# Patient Record
Sex: Female | Born: 1995 | Race: White | Hispanic: No | Marital: Single | Smoking: Former smoker
Health system: Southern US, Community
[De-identification: ages and names within clinical notes are randomized; demographics above are authoritative.]

---
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
## 2014-10-22 ENCOUNTER — Ambulatory Visit (INDEPENDENT_AMBULATORY_CARE_PROVIDER_SITE_OTHER): Payer: 59 | Admitting: Women's Health

## 2014-10-22 ENCOUNTER — Encounter: Payer: Self-pay | Admitting: Women's Health

## 2014-10-22 VITALS — BP 118/80 | Ht 66.0 in | Wt 169.0 lb

## 2014-10-22 DIAGNOSIS — Z01419 Encounter for gynecological examination (general) (routine) without abnormal findings: Secondary | ICD-10-CM

## 2014-10-22 DIAGNOSIS — N898 Other specified noninflammatory disorders of vagina: Secondary | ICD-10-CM | POA: Diagnosis not present

## 2014-10-22 DIAGNOSIS — Z3041 Encounter for surveillance of contraceptive pills: Secondary | ICD-10-CM

## 2014-10-22 DIAGNOSIS — Z113 Encounter for screening for infections with a predominantly sexual mode of transmission: Secondary | ICD-10-CM | POA: Diagnosis not present

## 2014-10-22 LAB — WET PREP FOR TRICH, YEAST, CLUE
CLUE CELLS WET PREP: NONE SEEN
TRICH WET PREP: NONE SEEN
WBC, Wet Prep HPF POC: NONE SEEN
YEAST WET PREP: NONE SEEN

## 2014-10-22 MED ORDER — DROSPIRENONE-ETHINYL ESTRADIOL 3-0.02 MG PO TABS: 1.0000 | ORAL_TABLET | Freq: Every day | ORAL | Status: DC

## 2014-10-22 NOTE — Patient Instructions (Signed)
Dyspareunia Dyspareunia is pain during sexual intercourse. It is most common in women, but it also happens in men.  CAUSES  Female The pain from this condition is usually felt when anything is put into the vagina, but any part of the genitals may cause pain during sex. Even sitting or wearing pants can cause pain. Sometimes, a cause cannot be found. Some causes of pain during intercourse are:  Infections of the skin around the vagina.  Vaginal infections, such as a yeast, bacterial, or viral infection.  Vaginismus. This is the inability to have anything put in the vagina even when the woman wants it to happen. There is an automatic muscle contraction and pain. The pain of the muscle contraction can be so severe that intercourse is impossible.  Allergic reaction from spermicides, semen, condoms, scented tampons, soaps, douches, and vaginal sprays.  A fluid-filled sac (cyst) on the Bartholin or Skene glands, located at the opening of the vagina.  Scar tissue in the vagina from a surgically enlarged opening (episiotomy) or tearing after delivering a baby.  Vaginal dryness. This is more common in menopause. The normal secretions of the vagina are decreased. Changes in estrogen levels and increased difficulty becoming aroused can cause painful sex. Vaginal dryness can also happen when taking birth control pills.  Thinning of the tissue (atrophy) of the vulva and vagina. This makes the area thinner, smaller, unable to stretch to accommodate a penis, and prone to infection and tearing.  Vulvar vestibulitis or vestibulodynia.This is a condition that causes pain involving the area around the entrance to the vagina.The most common cause in young women is birth control pills.Women with low estrogen levels (postmenopausal women) may also experience this.Other causes include allergic reactions, too many nerve endings, skin conditions, and pelvic muscles that cannot relax.  Vulvar dermatoses. This  includes skin conditions such as lichen sclerosus and lichen planus.  Lack of foreplay to lubricate the vagina. This can cause vaginal dryness.  Noncancerous tumors (fibroids) in the uterus.  Uterus lining tissue growing outside the uterus (endometriosis).  Pregnancy that starts in the fallopian tube (tubal pregnancy).  Pregnancy or breastfeeding your baby. This can cause vaginal dryness.  A tilting or prolapse of the uterus. Prolapse is when weak and stretched muscles around the uterus allow it to fall into the vagina.  Problems with the ovaries, cysts, or scar tissue. This may be worse with certain sexual positions.  Previous surgeries causing adhesions or scar tissue in the vagina or pelvis.  Bladder and intestinal problems.  Psychological problems (such as depression or anxiety). This may make pain worse.  Negative attitudes about sex, experiencing rape, sexual assault, and misinformation about sex. These issues are often related to some types of pain.  Previous pelvic infection, causing scar tissue in the pelvis and on the female organs.  Cyst or tumor on the ovary.  Cancer of the female organs.  Certain medicines.  Medical problems such as diabetes, arthritis, or thyroid disease. Female In men, there are many physical causes of sexual discomfort. Some causes of pain during intercourse are:  Infections of the prostate, bladder, or seminal vesicles. This can cause pain after ejaculation.  An inflamed bladder (interstitial cystitis). This may cause pain from ejaculation.  Gonorrheal infections. This may cause pain during ejaculation.  An inflamed urethra (urethritis) or inflamed prostate (prostatitis). This can make genital stimulation painful or uncomfortable.  Deformities of the penis, such as Peyronie's disease.  A tight foreskin.  Cancer of the female organs.    Psychological problems. This may make pain worse. DIAGNOSIS   Your caregiver will take a history and  have you describe where the pain is located (outside the vagina, in the vagina, in the pelvis). You may be asked when you experience pain, such as with penetration or with thrusting.  Following this, your caregiver will do a physical exam. Let your caregiver know if the exam is too painful.  During the final part of the female exam, your caregiver will feel your uterus and ovaries with one hand on the abdomen and one finger in your vagina. This is a pelvic exam.  Blood tests, a Pap test, cultures for infection, an ultrasound test, and X-rays may be done. You may need to see a specialist for female problems (gynecologist).  Your caregiver may do a CT scan, MRI, or laparoscopy. Laparoscopy is a procedure to look into the pelvis with a lighted tube, through a cut (incision) in the abdomen. TREATMENT  Your caregiver can help you determine the best course of treatment. Sometimes, more testing is done. Continue with the suggested testing until your caregiver feels sure about your diagnosis and how to treat it. Sometimes, it is difficult to find the reason for the pain. The search for the cause and treatment can be frustrating. Treatment often takes several weeks to a few months before you notice any improvement. You may also need to avoid sexual activity until symptoms improve.Continuing to have sex when it hurts can delay healing and actually make the problem worse. The treatment depends on the cause of the pain. Treatment may include:  Medicines such as antibiotics, vaginal or skin creams, hormones, or antidepressants.  Minor or major surgery.  Psychological counseling or group therapy.  Kegel exercises and vaginal dilators to help certain cases of vaginismus (spasms). Do this only if recommended by your caregiver.Kegel exercises can make some problems worse.  Applying lubrication as recommended by your caregiver if you have dryness.  Sex therapy for you and your sex partner. It is common for  the pain to continue after the reason for the pain has been treated. Some reasons for this include a conditioned response. This means the person having the pain becomes so familiar with the pain that the pain continues as a response, even though the cause is removed. Sex therapy can help with this problem. HOME CARE INSTRUCTIONS   Follow your caregiver's instructions about taking medicines, tests, counseling, and follow-up treatment.  Do not use scented tampons, douches, vaginal sprays, or soaps.  Use water-based lubricants for dryness. Oil lubricants can cause irritation.  Do not use spermicides or condoms that irritate you.  Openly discuss with your partner your sexual experience, your desires, foreplay, and different sexual positions for a more comfortable and enjoyable sexual relationship.  Join group sessions for therapy, if needed.  Practice safe sex at all times.  Empty your bladder before having intercourse.  Try different positions during sexual intercourse.  Take over-the-counter pain medicine recommended by your caregiver before having sexual intercourse.  Do not wear pantyhose. Knee-high and thigh-high hose are okay.  Avoid scrubbing your vulva with a washcloth. Wash the area gently and pat dry with a towel. SEEK MEDICAL CARE IF:   You develop vaginal bleeding after sexual intercourse.  You develop a lump at the opening of your vagina, even if it is not painful.  You have abnormal vaginal discharge.  You have vaginal dryness.  You have itching or irritation of the vulva or vagina.  You   develop a rash or reaction to your medicine. SEEK IMMEDIATE MEDICAL CARE IF:   You develop severe abdominal pain during or shortly after sexual intercourse. You could have a ruptured ovarian cyst or ruptured tubal pregnancy.  You have a fever.  You have painful or bloody urination.  You have painful sexual intercourse, and you never had it before.  You pass out after having  sexual intercourse.   This information is not intended to replace advice given to you by your health care provider. Make sure you discuss any questions you have with your health care provider.   Document Released: 01/10/2007 Document Revised: 03/15/2011 Document Reviewed: 07/23/2014 Elsevier Interactive Patient Education 2016 [place] Maintenance, Female Adopting a healthy lifestyle and getting preventive care can go a long way to promote health and wellness. Talk with your health care provider about what schedule of regular examinations is right for you. This is a good chance for you to check in with your provider about disease prevention and staying healthy. In between checkups, there are plenty of things you can do on your own. Experts have done a lot of research about which lifestyle changes and preventive measures are most likely to keep you healthy. Ask your health care provider for more information. WEIGHT AND DIET  Eat a healthy diet  Be sure to include plenty of vegetables, fruits, low-fat dairy products, and lean protein.  Do not eat a lot of foods high in solid fats, added sugars, or salt.  Get regular exercise. This is one of the most important things you can do for your health.  Most adults should exercise for at least 150 minutes each week. The exercise should increase your heart rate and make you sweat (moderate-intensity exercise).  Most adults should also do strengthening exercises at least twice a week. This is in addition to the moderate-intensity exercise.  Maintain a healthy weight  Body mass index (BMI) is a measurement that can be used to identify possible weight problems. It estimates body fat based on height and weight. Your health care provider can help determine your BMI and help you achieve or maintain a healthy weight.  For females 4 years of age and older:   A BMI below 18.5 is considered underweight.  A BMI of 18.5 to 24.9 is normal.  A  BMI of 25 to 29.9 is considered overweight.  A BMI of 30 and above is considered obese.  Watch levels of cholesterol and blood lipids  You should start having your blood tested for lipids and cholesterol at 20 years of age, then have this test every 5 years.  You may need to have your cholesterol levels checked more often if:  Your lipid or cholesterol levels are high.  You are older than 20 years of age.  You are at high risk for heart disease.  CANCER SCREENING   Lung Cancer  Lung cancer screening is recommended for adults 29-59 years old who are at high risk for lung cancer because of a history of smoking.  A yearly low-dose CT scan of the lungs is recommended for people who:  Currently smoke.  Have quit within the past 15 years.  Have at least a 30-pack-year history of smoking. A pack year is smoking an average of one pack of cigarettes a day for 1 year.  Yearly screening should continue until it has been 15 years since you quit.  Yearly screening should stop if you develop a health problem that would prevent  you from having lung cancer treatment.  Breast Cancer  Practice breast self-awareness. This means understanding how your breasts normally appear and feel.  It also means doing regular breast self-exams. Let your health care provider know about any changes, no matter how small.  If you are in your 20s or 30s, you should have a clinical breast exam (CBE) by a health care provider every 1-3 years as part of a regular health exam.  If you are 46 or older, have a CBE every year. Also consider having a breast X-ray (mammogram) every year.  If you have a family history of breast cancer, talk to your health care provider about genetic screening.  If you are at high risk for breast cancer, talk to your health care provider about having an MRI and a mammogram every year.  Breast cancer gene (BRCA) assessment is recommended for women who have family members with  BRCA-related cancers. BRCA-related cancers include:  Breast.  Ovarian.  Tubal.  Peritoneal cancers.  Results of the assessment will determine the need for genetic counseling and BRCA1 and BRCA2 testing. Cervical Cancer Your health care provider may recommend that you be screened regularly for cancer of the pelvic organs (ovaries, uterus, and vagina). This screening involves a pelvic examination, including checking for microscopic changes to the surface of your cervix (Pap test). You may be encouraged to have this screening done every 3 years, beginning at age 43.  For women ages 29-65, health care providers may recommend pelvic exams and Pap testing every 3 years, or they may recommend the Pap and pelvic exam, combined with testing for human papilloma virus (HPV), every 5 years. Some types of HPV increase your risk of cervical cancer. Testing for HPV may also be done on women of any age with unclear Pap test results.  Other health care providers may not recommend any screening for nonpregnant women who are considered low risk for pelvic cancer and who do not have symptoms. Ask your health care provider if a screening pelvic exam is right for you.  If you have had past treatment for cervical cancer or a condition that could lead to cancer, you need Pap tests and screening for cancer for at least 20 years after your treatment. If Pap tests have been discontinued, your risk factors (such as having a new sexual partner) need to be reassessed to determine if screening should resume. Some women have medical problems that increase the chance of getting cervical cancer. In these cases, your health care provider may recommend more frequent screening and Pap tests. Colorectal Cancer  This type of cancer can be detected and often prevented.  Routine colorectal cancer screening usually begins at 20 years of age and continues through 20 years of age.  Your health care provider may recommend screening at  an earlier age if you have risk factors for colon cancer.  Your health care provider may also recommend using home test kits to check for hidden blood in the stool.  A small camera at the end of a tube can be used to examine your colon directly (sigmoidoscopy or colonoscopy). This is done to check for the earliest forms of colorectal cancer.  Routine screening usually begins at age 63.  Direct examination of the colon should be repeated every 5-10 years through 20 years of age. However, you may need to be screened more often if early forms of precancerous polyps or small growths are found. Skin Cancer  Check your skin from head to  toe regularly.  Tell your health care provider about any new moles or changes in moles, especially if there is a change in a mole's shape or color.  Also tell your health care provider if you have a mole that is larger than the size of a pencil eraser.  Always use sunscreen. Apply sunscreen liberally and repeatedly throughout the day.  Protect yourself by wearing long sleeves, pants, a wide-brimmed hat, and sunglasses whenever you are outside. HEART DISEASE, DIABETES, AND HIGH BLOOD PRESSURE   High blood pressure causes heart disease and increases the risk of stroke. High blood pressure is more likely to develop in:  People who have blood pressure in the high end of the normal range (130-139/85-89 mm Hg).  People who are overweight or obese.  People who are African American.  If you are 57-35 years of age, have your blood pressure checked every 3-5 years. If you are 63 years of age or older, have your blood pressure checked every year. You should have your blood pressure measured twice--once when you are at a hospital or clinic, and once when you are not at a hospital or clinic. Record the average of the two measurements. To check your blood pressure when you are not at a hospital or clinic, you can use:  An automated blood pressure machine at a  pharmacy.  A home blood pressure monitor.  If you are between 40 years and 41 years old, ask your health care provider if you should take aspirin to prevent strokes.  Have regular diabetes screenings. This involves taking a blood sample to check your fasting blood sugar level.  If you are at a normal weight and have a low risk for diabetes, have this test once every three years after 20 years of age.  If you are overweight and have a high risk for diabetes, consider being tested at a younger age or more often. PREVENTING INFECTION  Hepatitis B  If you have a higher risk for hepatitis B, you should be screened for this virus. You are considered at high risk for hepatitis B if:  You were born in a country where hepatitis B is common. Ask your health care provider which countries are considered high risk.  Your parents were born in a high-risk country, and you have not been immunized against hepatitis B (hepatitis B vaccine).  You have HIV or AIDS.  You use needles to inject street drugs.  You live with someone who has hepatitis B.  You have had sex with someone who has hepatitis B.  You get hemodialysis treatment.  You take certain medicines for conditions, including cancer, organ transplantation, and autoimmune conditions. Hepatitis C  Blood testing is recommended for:  Everyone born from [birth year] through 1966.  Anyone with known risk factors for hepatitis C. Sexually transmitted infections (STIs)  You should be screened for sexually transmitted infections (STIs) including gonorrhea and chlamydia if:  You are sexually active and are younger than 20 years of age.  You are older than 20 years of age and your health care provider tells you that you are at risk for this type of infection.  Your sexual activity has changed since you were last screened and you are at an increased risk for chlamydia or gonorrhea. Ask your health care provider if you are at risk.  If you do not  have HIV, but are at risk, it may be recommended that you take a prescription medicine daily to prevent HIV infection. This  is called pre-exposure prophylaxis (PrEP). You are considered at risk if:  You are sexually active and do not regularly use condoms or know the HIV status of your partner(s).  You take drugs by injection.  You are sexually active with a partner who has HIV. Talk with your health care provider about whether you are at high risk of being infected with HIV. If you choose to begin PrEP, you should first be tested for HIV. You should then be tested every 3 months for as long as you are taking PrEP.  PREGNANCY   If you are premenopausal and you may become pregnant, ask your health care provider about preconception counseling.  If you may become pregnant, take 400 to 800 micrograms (mcg) of folic acid every day.  If you want to prevent pregnancy, talk to your health care provider about birth control (contraception). OSTEOPOROSIS AND MENOPAUSE   Osteoporosis is a disease in which the bones lose minerals and strength with aging. This can result in serious bone fractures. Your risk for osteoporosis can be identified using a bone density scan.  If you are 24 years of age or older, or if you are at risk for osteoporosis and fractures, ask your health care provider if you should be screened.  Ask your health care provider whether you should take a calcium or vitamin D supplement to lower your risk for osteoporosis.  Menopause may have certain physical symptoms and risks.  Hormone replacement therapy may reduce some of these symptoms and risks. Talk to your health care provider about whether hormone replacement therapy is right for you.  HOME CARE INSTRUCTIONS   Schedule regular health, dental, and eye exams.  Stay current with your immunizations.   Do not use any tobacco products including cigarettes, chewing tobacco, or electronic cigarettes.  If you are pregnant, do not  drink alcohol.  If you are breastfeeding, limit how much and how often you drink alcohol.  Limit alcohol intake to no more than 1 drink per day for nonpregnant women. One drink equals 12 ounces of beer, 5 ounces of wine, or 1 ounces of hard liquor.  Do not use street drugs.  Do not share needles.  Ask your health care provider for help if you need support or information about quitting drugs.  Tell your health care provider if you often feel depressed.  Tell your health care provider if you have ever been abused or do not feel safe at home.   This information is not intended to replace advice given to you by your health care provider. Make sure you discuss any questions you have with your health care provider.   Document Released: 07/06/2010 Document Revised: 01/11/2014 Document Reviewed: 11/22/2012 Elsevier Interactive Patient Education [employer].

## 2014-10-22 NOTE — Progress Notes (Signed)
Traci Jones [birth year] [id number]    History:    Presents for annual exam.  Complains of pain during and after sexual intercourse. Has one partner who is not monogamous, does not use condoms. Denies abdominal pain, nausea, vaginal discharge or odor, urinary frequency, or dysuria. On OCP with no complaints. Menstrual cycles are regular and controlled. Began menstrual cycle today. Has no other issues or complaints.  Thinks that she received gardasil is not sure  Past medical history, past surgical history, family history and social history Part time student at [employer]. Major undecided. Works two jobs . Lives at home with parents.   ROS:  A ROS was performed and pertinent positives and negatives are included.  Exam:  Filed Vitals:   10/22/14 1454  BP: 118/80    General appearance:  Normal Thyroid:  Symmetrical, normal in size, without palpable masses or nodularity. Respiratory  Auscultation:  Clear without wheezing or rhonchi Cardiovascular  Auscultation:  Regular rate, without rubs, murmurs or gallops  Edema/varicosities:  Not grossly evident Abdominal  Soft,nontender, without masses, guarding or rebound.  Liver/spleen:  No organomegaly noted  Hernia:  None appreciated  Skin  Inspection:  Grossly normal   Breasts: Examined lying and sitting.     Right: Without masses, retractions, discharge or axillary adenopathy.     Left: Without masses, retractions, discharge or axillary adenopathy. Gentitourinary   Inguinal/mons:  Normal without inguinal adenopathy  External genitalia:  Normal  BUS/Urethra/Skene's glands:  Normal  Vagina:  Normal, moderate amount of menstrual blood noted in canal.   Cervix:  Normal  Uterus:   normal in size, shape and contour.  Midline and mobile  Adnexa/parametria:     Rt: Without masses or tenderness.   Lt: Without masses or tenderness.  Anus and perineum: Normal    Wet prep negative.   Assessment/Plan:  19 y.o. S WF G0 for annual exam.    Regular cycles on OCs STD screen Dyspareunia  Plan:  CBC, UA with culture pending, GC/Chlamydia, HIV, hep B, C, RPR. OCs reviewed was on Yasmin 3-0.03 Mg changed to Yaz 3-0.02 Mg tablet. Condoms encouraged until permanent partner. SBE's, campus safety, decrease calories, exercise at least a few times a week, wear seatbelt encouraged. Follow up annually, or sooner for questions/concerns. Instructed to find out if received gardasil if not return to office for series.    Huel Cote [facility], 3:47 PM 10/22/2014

## 2014-10-22 NOTE — Progress Notes (Deleted)
Traci Jones 05/02/1995 [id number]    History:    Presents for annual exam.  ***  Past medical history, past surgical history, family history and social history were all reviewed and documented in the EPIC chart.  ROS:  A ROS was performed and pertinent positives and negatives are included.  Exam:  Filed Vitals:   10/22/14 1454  BP: 118/80    General appearance:  Normal Thyroid:  Symmetrical, normal in size, without palpable masses or nodularity. Respiratory  Auscultation:  Clear without wheezing or rhonchi Cardiovascular  Auscultation:  Regular rate, without rubs, murmurs or gallops  Edema/varicosities:  Not grossly evident Abdominal  Soft,nontender, without masses, guarding or rebound.  Liver/spleen:  No organomegaly noted  Hernia:  None appreciated  Skin  Inspection:  Grossly normal   Breasts: Examined lying and sitting.     Right: Without masses, retractions, discharge or axillary adenopathy.     Left: Without masses, retractions, discharge or axillary adenopathy. Gentitourinary   Inguinal/mons:  Normal without inguinal adenopathy  External genitalia:  Normal  BUS/Urethra/Skene's glands:  Normal  Vagina:  Normal  Cervix:  Normal  Uterus:  ***verted, normal in size, shape and contour.  Midline and mobile  Adnexa/parametria:     Rt: Without masses or tenderness.   Lt: Without masses or tenderness.  Anus and perineum: Normal  Digital rectal exam: Normal sphincter tone without palpated masses or tenderness  Assessment/Plan:  19 y.o.  for annual exam.   ***.    Huel Cote [facility], 3:43 PM 10/22/2014

## 2014-10-23 LAB — URINALYSIS W MICROSCOPIC + REFLEX CULTURE
BACTERIA UA: NONE SEEN [HPF]
BILIRUBIN URINE: NEGATIVE
Casts: NONE SEEN [LPF]
Crystals: NONE SEEN [HPF]
GLUCOSE, UA: NEGATIVE
LEUKOCYTES UA: NEGATIVE
NITRITE: NEGATIVE
PH: 5.5 (ref 5.0–8.0)
PROTEIN: NEGATIVE
RBC / HPF: >60 RBC/HPF — AB (ref <=2)
SPECIFIC GRAVITY, URINE: 1.032 (ref 1.001–1.035)
WBC UA: NONE SEEN WBC/HPF (ref <=5)
YEAST: NONE SEEN [HPF]

## 2014-10-23 LAB — GC/CHLAMYDIA PROBE AMP
CT Probe RNA: NEGATIVE
GC Probe RNA: NEGATIVE

## 2014-10-24 LAB — URINE CULTURE
Colony Count: NO GROWTH
ORGANISM ID, BACTERIA: NO GROWTH

## 2015-03-06 ENCOUNTER — Ambulatory Visit (INDEPENDENT_AMBULATORY_CARE_PROVIDER_SITE_OTHER): Payer: 59 | Admitting: Obstetrics and Gynecology

## 2015-03-06 ENCOUNTER — Encounter: Payer: Self-pay | Admitting: Obstetrics and Gynecology

## 2015-03-06 VITALS — BP 100/62 | HR 76 | Resp 18 | Ht 67.0 in | Wt 163.0 lb

## 2015-03-06 DIAGNOSIS — L739 Follicular disorder, unspecified: Secondary | ICD-10-CM

## 2015-03-06 MED ORDER — MUPIROCIN CALCIUM 2 % EX CREA: 1.0000 "application " | TOPICAL_CREAM | Freq: Two times a day (BID) | CUTANEOUS | Status: AC

## 2015-03-06 NOTE — Progress Notes (Signed)
Patient ID: Traci Jones, female   DOB: 1995/02/11, 20 y.o.   MRN: [id number] GYNECOLOGY  VISIT   HPI: 20 y.o.   Single  Caucasian  female   [id number] with Patient's last menstrual period was 02/06/2015 (approximate).   here for tender "bump"in perineal area for 2 weeks.  She also complains of ingrown hairs in pubic area.    Told to stop shaving by prior provider.  Also reports a lump in her labia.  Not painful.  Tried to drain it and she got blood out of it.   Not using OTC products.  Bought something called tinned skin but has not used in a long time.  Had scabies. Treated through dermatology 6 months ago.   Had shingles last year.  Had outbreak on her chest.   New new exposures.   New partner - female.   GYNECOLOGIC HISTORY: Patient's last menstrual period was 02/06/2015 (approximate). Contraception:OCPs--Yaz Menopausal hormone therapy: n/a Last mammogram: n/a Last pap smear: n/a        OB History    Gravida Para Term Preterm AB TAB SAB Ectopic Multiple Living   0 0 0 0 0 0 0 0 0 0          There are no active problems to display for this patient.   History reviewed. No pertinent past medical history.  History reviewed. No pertinent past surgical history.  Current Outpatient Prescriptions  Medication Sig Dispense Refill  . drospirenone-ethinyl estradiol (YASMIN 28) 3-0.03 MG tablet Take 1 tablet by mouth daily.    . mupirocin cream (BACTROBAN) 2 % Apply 1 application topically 2 (two) times daily. 15 g 0   No current facility-administered medications for this visit.     ALLERGIES: Review of patient's allergies indicates no known allergies.  Family History  Problem Relation Age of Onset  . Prostate cancer Father   . Emphysema Maternal Grandmother   . Lung cancer Maternal Grandmother   . Lung cancer Maternal Grandfather     Social History   Social History  . Marital Status: Single    Spouse Name: N/A  . Number of Children: N/A  . Years of  Education: N/A   Occupational History  . Not on file.   Social History Main Topics  . Smoking status: Former Research scientist (life sciences)  . Smokeless tobacco: Not on file  . Alcohol Use: 0.0 oz/week    0 Standard drinks or equivalent per week     Comment: 1-2 drinks per month  . Drug Use: Yes    Special: Marijuana  . Sexual Activity:    Partners: Female, Female    Birth Control/ Protection: Pill     Comment: INTERCOURSE AGE 81, SEXUAL PARTNERS LESS THAN 5   Other Topics Concern  . Not on file   Social History Narrative    ROS:  Pertinent items are noted in HPI.  PHYSICAL EXAMINATION:    BP 100/62 mmHg  Pulse 76  Resp 18  Ht 5\' 7"  (1.702 m)  Wt 163 lb (73.936 kg)  BMI 25.52 kg/m2  LMP 02/06/2015 (Approximate)    General appearance: alert, cooperative and appears stated age Head: Normocephalic, without obvious abnormality, atraumatic Lungs: clear to auscultation bilaterally Heart: regular rate and rhythm Abdomen: soft, non-tender; bowel sounds normal; no masses,  no organomegaly No abnormal inguinal nodes palpated   Pelvic: External genitalia:   Small 5 mm abscess of the right labia minora.  Mildly tender.  Some inflammation of hair follicles.  Urethra:  normal appearing urethra with no masses, tenderness or lesions              Bartholins and Skenes: normal                 Vagina: normal appearing vagina with normal color and discharge, no lesions              Cervix: no lesions             Bimanual Exam:  Uterus:  normal size, contour, position, consistency, mobility, non-tender              Adnexa: normal adnexa and no mass, fullness, tenderness           Chaperone was present for exam.  ASSESSMENT  Folliculitis.  Very small vulvar abscess from folliculitis.   PLAN  Counseled regarding folliculitis.  Bactroban bid for one week.  I do not think the patient needs an oral abx.  Warm compresses.  Declines STD testing.  Discussed female condom use.  Follow up for  annual exam at end of October or beginning of November.  Follow up for yearly exams and prn.    An After Visit Summary was printed and given to the patient.  _20_____ minutes face to face time of which over 50% was spent in counseling.

## 2015-03-06 NOTE — Patient Instructions (Addendum)
Mupirocin skin cream or ointment What is this medicine? MUPIROCIN (myoo PEER oh sin) is an antibiotic. It is used on the skin to treat skin infections. This medicine may be used for other purposes; ask your health care provider or pharmacist if you have questions. What should I tell my health care provider before I take this medicine? They need to know if you have any of these conditions: -an unusual or allergic reaction to mupirocin, polyethylene glycol (PEG), or other topical antibiotic medicine -pregnant or trying to get pregnant -breast-feeding How should I use this medicine? This medicine is for external use only. Follow the directions on the prescription label. Wash your hands before and after use. Before applying, wash the affected area with mild soap and water and pat dry. Apply a small amount to the affected area and rub gently. You can cover the area with a gauze dressing. Do not get this medicine in your eyes. If you do, rinse out with plenty of cool tap water. Do not use your medicine more often than directed. Finish the full course of medicine prescribed by your doctor or health care professional even if you think your condition is better. Do not use over large areas of burnt skin. Talk to your pediatrician regarding the use of this medicine in children. Special care may be needed. Overdosage: If you think you have taken too much of this medicine contact a poison control center or emergency room at once. NOTE: This medicine is only for you. Do not share this medicine with others. What if I miss a dose? If you miss a dose, take it as soon as you can. If it is almost time for your next dose, take only that dose. Do not take double or extra doses. What may interact with this medicine? Interactions are not expected. Do not use any other skin products on the affected area without telling your doctor or health care professional. This list may not describe all possible interactions. Give your  health care provider a list of all the medicines, herbs, non-prescription drugs, or dietary supplements you use. Also tell them if you smoke, drink alcohol, or use illegal drugs. Some items may interact with your medicine. What should I watch for while using this medicine? Tell your doctor or health care professional if your skin condition does not begin to improve within 3 to 5 days. What side effects may I notice from receiving this medicine? Side effects that you should report to your doctor or health care professional as soon as possible: -skin rash, redness, continued swelling, burning, itching, stinging, or pain Side effects that usually do not require medical attention (report to your doctor or health care professional if they continue or are bothersome): -dry skin, itching This list may not describe all possible side effects. Call your doctor for medical advice about side effects. You may report side effects to FDA at 1-800-FDA-1088. Where should I keep my medicine? Keep out of the reach of children. Store at room temperature between 20 and 25 degrees C (68 and 77 degrees F). Throw away any unused medicine after the expiration date. NOTE: This sheet is a summary. It may not cover all possible information. If you have questions about this medicine, talk to your doctor, pharmacist, or health care provider.    2016, Elsevier/Gold Standard. (A999333 A999333)  Folliculitis Folliculitis is redness, soreness, and swelling (inflammation) of the hair follicles. This condition can occur anywhere on the body. People with weakened immune systems, diabetes,  or obesity have a greater risk of getting folliculitis. CAUSES  Bacterial infection. This is the most common cause.  Fungal infection.  Viral infection.  Contact with certain chemicals, especially oils and tars. Long-term folliculitis can result from bacteria that live in the nostrils. The bacteria may trigger multiple outbreaks of  folliculitis over time. SYMPTOMS Folliculitis most commonly occurs on the scalp, thighs, legs, back, buttocks, and areas where hair is shaved frequently. An early sign of folliculitis is a small, white or yellow, pus-filled, itchy lesion (pustule). These lesions appear on a red, inflamed follicle. They are usually less than 0.2 inches (5 mm) wide. When there is an infection of the follicle that goes deeper, it becomes a boil or furuncle. A group of closely packed boils creates a larger lesion (carbuncle). Carbuncles tend to occur in hairy, sweaty areas of the body. DIAGNOSIS  Your caregiver can usually tell what is wrong by doing a physical exam. A sample may be taken from one of the lesions and tested in a lab. This can help determine what is causing your folliculitis. TREATMENT  Treatment may include:  Applying warm compresses to the affected areas.  Taking antibiotic medicines orally or applying them to the skin.  Draining the lesions if they contain a large amount of pus or fluid.  Laser hair removal for cases of long-lasting folliculitis. This helps to prevent regrowth of the hair. HOME CARE INSTRUCTIONS  Apply warm compresses to the affected areas as directed by your caregiver.  If antibiotics are prescribed, take them as directed. Finish them even if you start to feel better.  You may take over-the-counter medicines to relieve itching.  Do not shave irritated skin.  Follow up with your caregiver as directed. SEEK IMMEDIATE MEDICAL CARE IF:   You have increasing redness, swelling, or pain in the affected area.  You have a fever. MAKE SURE YOU:  Understand these instructions.  Will watch your condition.  Will get help right away if you are not doing well or get worse.   This information is not intended to replace advice given to you by your health care provider. Make sure you discuss any questions you have with your health care provider.   Document Released: 03/01/2001  Document Revised: 01/11/2014 Document Reviewed: 03/23/2011 Elsevier Interactive Patient Education [employer].

## 2015-03-10 ENCOUNTER — Encounter: Payer: 59 | Admitting: Obstetrics and Gynecology

## 2015-05-24 ENCOUNTER — Encounter (HOSPITAL_COMMUNITY): Payer: Self-pay

## 2015-05-24 ENCOUNTER — Emergency Department (HOSPITAL_COMMUNITY): Payer: 59

## 2015-05-24 ENCOUNTER — Emergency Department (HOSPITAL_COMMUNITY)
Admission: EM | Admit: 2015-05-24 | Discharge: 2015-05-24 | Disposition: A | Discharge status: Home / Self Care | Payer: 59 | Attending: Emergency Medicine | Admitting: Emergency Medicine

## 2015-05-24 DIAGNOSIS — R079 Chest pain, unspecified: Secondary | ICD-10-CM | POA: Insufficient documentation

## 2015-05-24 DIAGNOSIS — R748 Abnormal levels of other serum enzymes: Secondary | ICD-10-CM | POA: Diagnosis not present

## 2015-05-24 DIAGNOSIS — Z87891 Personal history of nicotine dependence: Secondary | ICD-10-CM | POA: Diagnosis not present

## 2015-05-24 DIAGNOSIS — R0789 Other chest pain: Secondary | ICD-10-CM | POA: Diagnosis present

## 2015-05-24 LAB — BASIC METABOLIC PANEL
ANION GAP: 10 (ref 5–15)
BUN: 11 mg/dL (ref 6–20)
CHLORIDE: 108 mmol/L (ref 101–111)
CO2: 22 mmol/L (ref 22–32)
Calcium: 9 mg/dL (ref 8.9–10.3)
Creatinine, Ser: 0.79 mg/dL (ref 0.44–1.00)
GFR calc Af Amer: >60 mL/min (ref >60)
GLUCOSE: 91 mg/dL (ref 65–99)
POTASSIUM: 3.8 mmol/L (ref 3.5–5.1)
Sodium: 140 mmol/L (ref 135–145)

## 2015-05-24 LAB — CK: Total CK: 945 U/L — ABNORMAL HIGH (ref 38–234)

## 2015-05-24 LAB — I-STAT BETA HCG BLOOD, ED (MC, WL, AP ONLY): I-stat hCG, quantitative: <5 m[IU]/mL (ref <5)

## 2015-05-24 LAB — I-STAT TROPONIN, ED: TROPONIN I, POC: 0 ng/mL (ref 0.00–0.08)

## 2015-05-24 LAB — CBC
HEMATOCRIT: 39.7 % (ref 36.0–46.0)
HEMOGLOBIN: 12.8 g/dL (ref 12.0–15.0)
MCH: 27.3 pg (ref 26.0–34.0)
MCHC: 32.2 g/dL (ref 30.0–36.0)
MCV: 84.6 fL (ref 78.0–100.0)
Platelets: 191 10*3/uL (ref 150–400)
RBC: 4.69 MIL/uL (ref 3.87–5.11)
RDW: 12.7 % (ref 11.5–15.5)
WBC: 4.6 10*3/uL (ref 4.0–10.5)

## 2015-05-24 IMAGING — CT CT ANGIO CHEST
2 of 9 series · 14 of 36 positions shown · IV contrast (Iohexol (Omnipaque 350))
Comparison: Chest radiograph from one day prior.

CLINICAL DATA: Elevated D-dimer and chest pain.

EXAM:
CT ANGIOGRAPHY CHEST WITH CONTRAST
TECHNIQUE: Multidetector CT imaging of the chest was performed using the
standard protocol during bolus administration of intravenous
contrast. Multiplanar CT image reconstructions and MIPs were
obtained to evaluate the vascular anatomy.
CONTRAST:  63 cc Isovue 370 IV.

[Series 402: thins pacs · axial · 0.59mm/px · z∈[-235,-19]mm · 13 of 252 slices shown]
[im 18/252  lung]
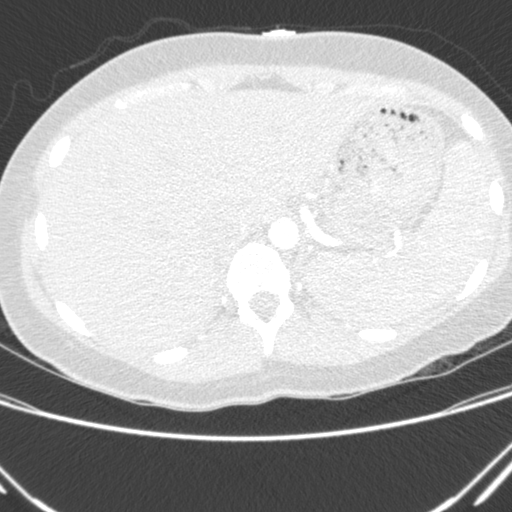
[im 36/252  mediastinal]
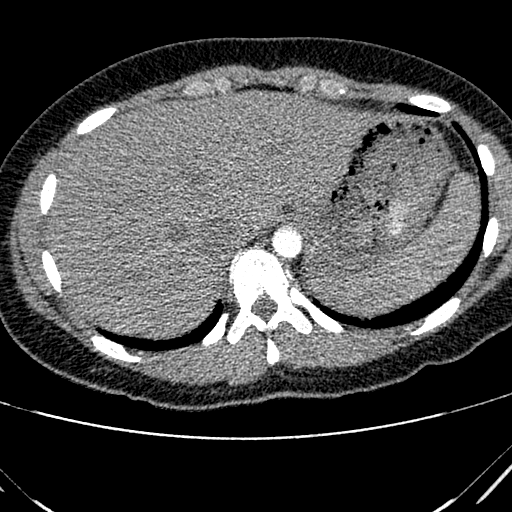
[im 54/252  lung]
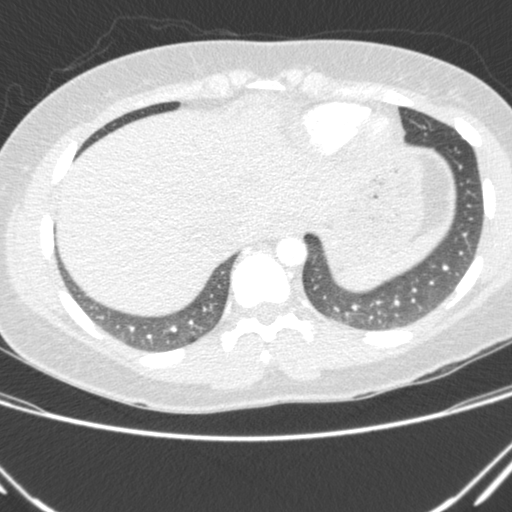
[im 72/252  mediastinal]
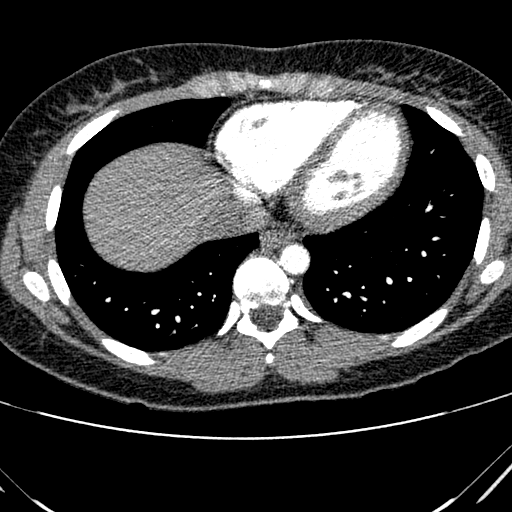
[im 90/252  lung]
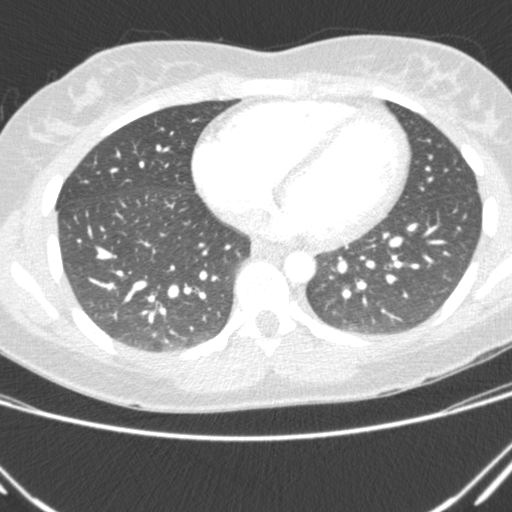
[im 108/252  mediastinal]
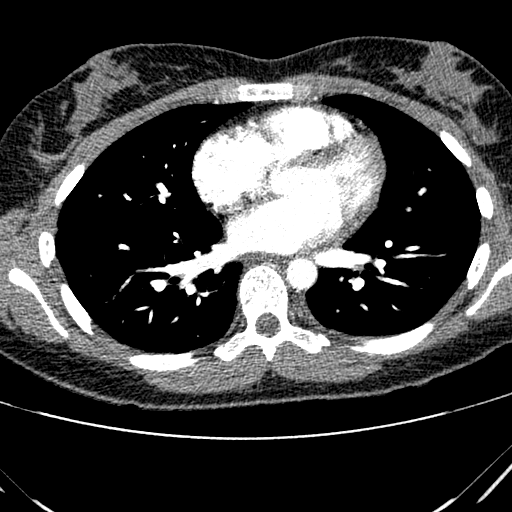
[im 126/252  lung]
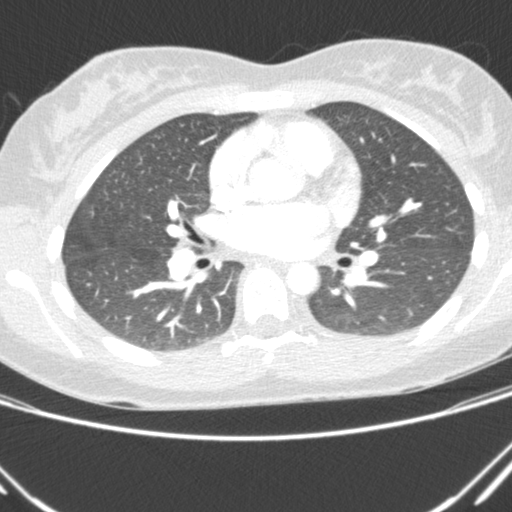
[im 144/252  mediastinal]
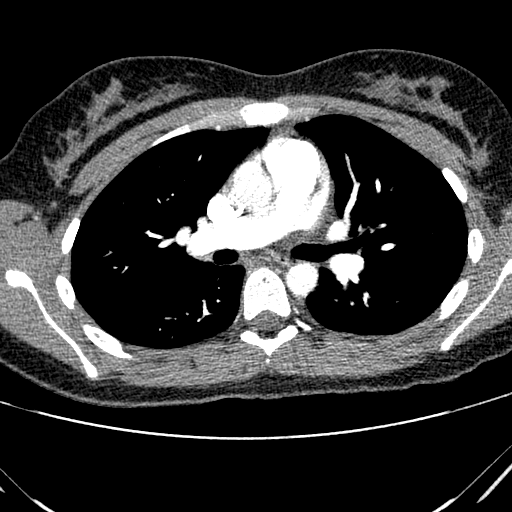
[im 162/252  lung]
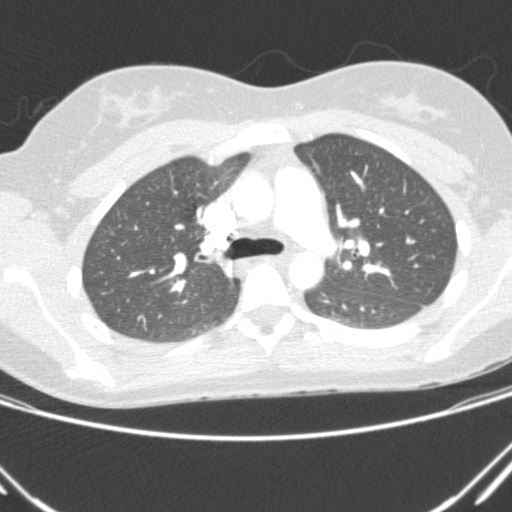
[im 180/252  mediastinal]
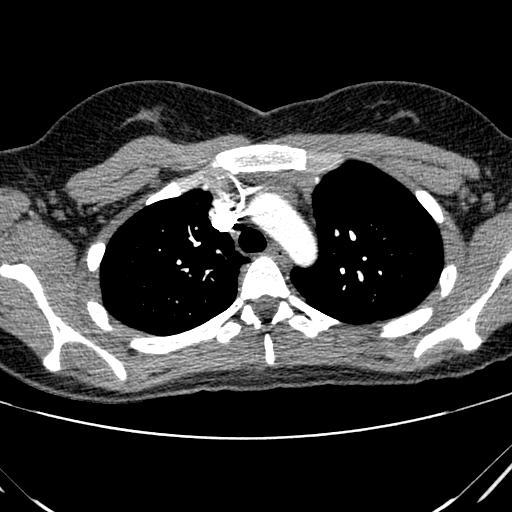
[im 198/252  lung]
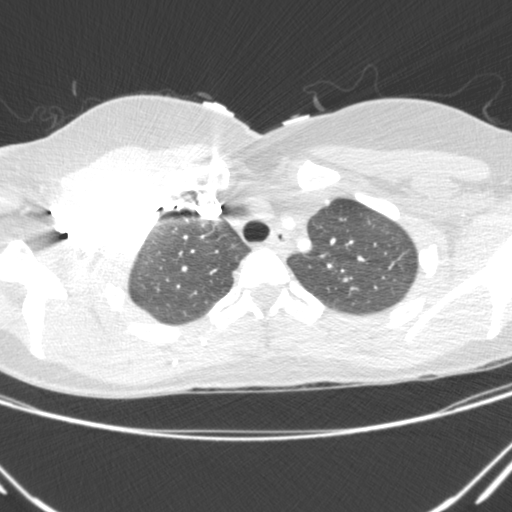
[im 216/252  mediastinal]
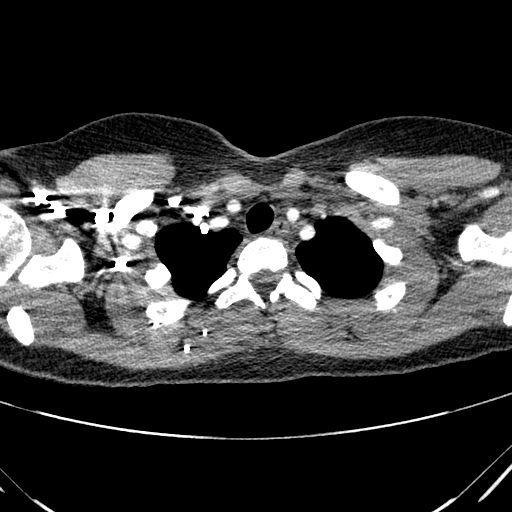
[im 234/252  lung]
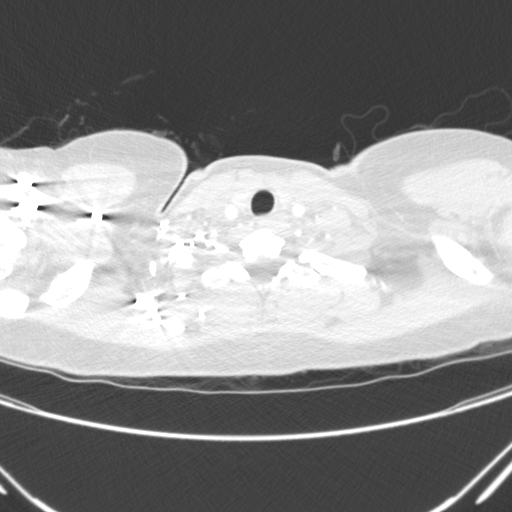

[Series 404: coronal 2x2 · coronal · 0.59mm/px · 1 of 96 slices shown]
[im 48/96  mediastinal]
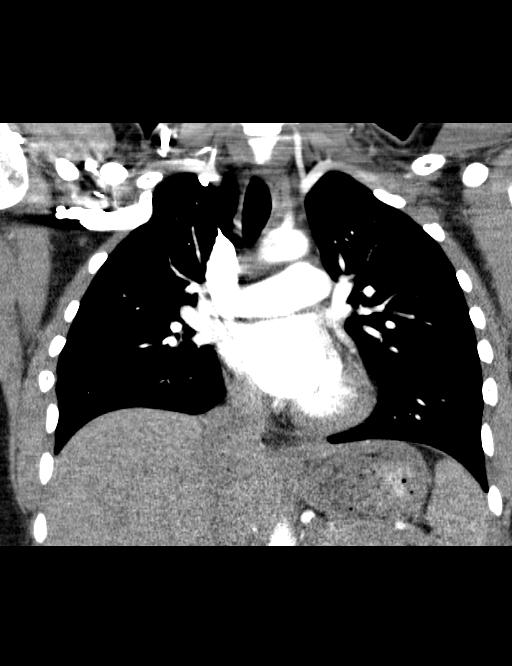

[14 of 36 positions shown; findings below may reference images not displayed]

FINDINGS: Mediastinum/Nodes: The study is high quality for the evaluation of
pulmonary embolism. There are no filling defects in the central,
lobar, segmental or subsegmental pulmonary artery branches to
suggest acute pulmonary embolism. Great vessels are normal in course
and caliber. Normal heart size. No pericardial fluid/thickening.
Normal visualized thyroid. Normal esophagus. No pathologically
enlarged axillary, mediastinal or hilar lymph nodes.

Lungs/Pleura: No pneumothorax. No pleural effusion. There are 3 mm
subpleural pulmonary nodules in the left upper lobe (series 403/
image 35) and left lower lobe (series 403/ image 85). No acute
consolidative airspace disease, additional significant pulmonary
nodules or lung masses.

Upper abdomen: Unremarkable.

Musculoskeletal:  No aggressive appearing focal osseous lesions.

Review of the MIP images confirms the above findings.
IMPRESSION: 1. No pulmonary embolism.
2. Two 3 mm subpleural pulmonary nodules in the left lung, almost
certainly benign. No follow-up needed if patient is low-risk (and
has no known or suspected primary neoplasm). Non-contrast chest CT
can be considered in 12 months if patient is high-risk. This
recommendation follows the consensus statement: Guidelines for
Management of Incidental Pulmonary Nodules Detected on CT
Images:From the [HOSPITAL] 8930; published online before
print (10.1148/[id number]).

## 2015-05-24 MED ORDER — SODIUM CHLORIDE 0.9 % IV BOLUS (SEPSIS)
1000.0000 mL | Freq: Once | INTRAVENOUS | Status: AC
  Administered 2015-05-24: 1000 mL via INTRAVENOUS

## 2015-05-24 MED ORDER — IOPAMIDOL (ISOVUE-370) INJECTION 76%
INTRAVENOUS | Status: AC
  Administered 2015-05-24: 90 mL
  Filled 2015-05-24: qty 100

## 2015-05-24 NOTE — ED Notes (Signed)
Spoke with [facility] and received verbal orders to redraw CK and d-dimer

## 2015-05-24 NOTE — ED Notes (Signed)
Patient here after being called by doctors office due to elevated CK and d-dimer that was drawn yesterday. Has had sharp CP x 2 days. Denies shortness of breath on assessment. Was prescribed mobic and did get some relief with that

## 2015-05-24 NOTE — ED Provider Notes (Signed)
CSN: [id number]     Arrival date & time 05/24/15  [id number] History   First MD Initiated Contact with Patient 05/24/15 [phone]     Chief Complaint  Patient presents with  . Chest Pain  . CK, d-dimer elevated      (Consider location/radiation/quality/duration/timing/severity/associated sxs/prior Treatment) HPI Traci Jones is a 20 y.o. female with no medical problems, presents to emergency department complaining of chest pain and abnormal labs. Patient states that she has had chest pain the last 2 days in the left side of the chest. States it is worse with deep breathing. Pain is sharp. States it is constant. About 4 days ago she had some pain in the left upper back, which was pleuritic as well, but states it resolved after she was given a muscle relaxant by an urgent care. States she went to the urgent care again 2 days ago with the chest pain and they do blood work and given her myopic for her pain. She states her EKG there was normal. Today she received a phone call from the urgent care telling her that her CK level was 980 and her d-dimer was elevated at 0.65. She was told to go to the emergency department. Patient states that her chest pain is actually much better after taking motivated currently still there but about 2 out of 10. Patient reports recent mono and strep infection just a month ago which has now improved. She was given steroids and antibiotics for that. She also states she has been on birth control but stopped taking it about 4 days ago. She is a former smoker. She reports no pain or swelling in her legs. She reports no recent travel or surgeries. No other complaints.  History reviewed. No pertinent past medical history. History reviewed. No pertinent past surgical history. Family History  Problem Relation Age of Onset  . Prostate cancer Father   . Emphysema Maternal Grandmother   . Lung cancer Maternal Grandmother   . Lung cancer Maternal Grandfather    Social History  Substance  Use Topics  . Smoking status: Former Research scientist (life sciences)  . Smokeless tobacco: None  . Alcohol Use: 0.0 oz/week    0 Standard drinks or equivalent per week     Comment: 1-2 drinks per month   OB History    Gravida Para Term Preterm AB TAB SAB Ectopic Multiple Living   0 0 0 0 0 0 0 0 0 0      Review of Systems  Constitutional: Negative for fever and chills.  Respiratory: Positive for chest tightness. Negative for cough and shortness of breath.   Cardiovascular: Positive for chest pain. Negative for palpitations and leg swelling.  Gastrointestinal: Negative for nausea, vomiting, abdominal pain and diarrhea.  Genitourinary: Negative for dysuria, flank pain and pelvic pain.  Musculoskeletal: Positive for back pain. Negative for myalgias, arthralgias, neck pain and neck stiffness.  Skin: Negative for rash.  Neurological: Negative for dizziness, weakness and headaches.  All other systems reviewed and are negative.     Allergies  Review of patient's allergies indicates no known allergies.  Home Medications   Prior to Admission medications   Medication Sig Start Date End Date Taking? Authorizing Provider  drospirenone-ethinyl estradiol (YASMIN 28) 3-0.03 MG tablet Take 1 tablet by mouth daily. 02/24/15   Historical Provider, MD  mupirocin cream (BACTROBAN) 2 % Apply 1 application topically 2 (two) times daily. 03/06/15   Nunzio Cobbs, MD   BP 112/82 mmHg  Pulse 70  Temp(Src) 97.3 F (36.3 C) (Oral)  Resp 15  Ht 5\' 6"  (1.676 m)  Wt 70.308 kg  BMI 25.03 kg/m2  SpO2 100%  LMP 05/22/2015 Physical Exam  Constitutional: She appears well-developed and well-nourished. No distress.  HENT:  Head: Normocephalic.  Eyes: Conjunctivae are normal.  Neck: Neck supple.  Cardiovascular: Normal rate, regular rhythm and normal heart sounds.   Pulmonary/Chest: Effort normal and breath sounds normal. No respiratory distress. She has no wheezes. She has no rales. She exhibits no tenderness.   Abdominal: Soft. Bowel sounds are normal. She exhibits no distension. There is no tenderness. There is no rebound.  Musculoskeletal: She exhibits no edema.  Negative Homans sign bilaterally. No swelling in lower extremities  Neurological: She is alert.  Skin: Skin is warm and dry.  Psychiatric: She has a normal mood and affect. Her behavior is normal.  Nursing note and vitals reviewed.   ED Course  Procedures (including critical care time) Labs Review Labs Reviewed  CK - Abnormal; Notable for the following:    Total CK 945 (*)    All other components within normal limits  BASIC METABOLIC PANEL  CBC  I-STAT TROPOININ, ED  I-STAT BETA HCG BLOOD, ED ([facility], [facility], [facility] ONLY)    Imaging Review No results found. I have personally reviewed and evaluated these images and lab results as part of my medical decision-making.   EKG Interpretation None      MDM   Final diagnoses:  Chest pain, unspecified chest pain type  Elevated CK   Patient with positive d-dimer. Urgent care, still having some chest pain. Will get a CT ages rule out PE. Patient had also elevated CK level, however CK-MB at urgent care was 0. She denies any recent strenuous activity or injuries. She did have recent strep and mono infection. Question possible myocarditis.   1:04 PM Labs unremarkable except for again elevated CK level of 945. Etiology is unknown at this time. I will hydrated patient with 2 L of normal saline. CT of her chest was unremarkable with no evidence of PE. Nodules are noted, however patient is low risk for neoplasm. Results discussed with patient and her family. I will have her follow-up with her primary care doctor for further evaluation of her elevated CK level. Instructed her to drink lately of fluids at home, no strenuous activity, close follow-up. Return precautions discussed.  Filed Vitals:   05/24/15 0915 05/24/15 1000 05/24/15 1030 05/24/15 1130  BP: 112/82 105/71 106/71 105/63  Pulse: 70 61  71 74  Temp:    98 F (36.7 C)  TempSrc:    Oral  Resp: 15 18 13 16   Height:      Weight:      SpO2: 100% 100% 100% 100%     Jeannett Senior, PA-C 05/24/15 [place], MD 05/24/15 1656

## 2015-05-24 NOTE — ED Notes (Signed)
Patient transported to CT 

## 2015-05-24 NOTE — Discharge Instructions (Signed)
Continue mobic for you pain. Avoid strenuous activity. Make sure to drink plenty of fluids. Follow up with your doctor for further evaluation of your CK level.   Creatine Kinase Test WHY AM I HAVING THIS TEST? The creatine kinase (CK) test is performed to determine if there has been damage to muscle tissue in your body. This test can be used to help diagnose heart attack, neurologic diseases, or skeletal diseases. Three different forms of CK are present in your body. They are referred to as isoenzymes:  CK-MM is found in your skeletal muscles and heart.  CK-MB is found mostly in your heart. (**yours was normal**)  CK-BB is found mostly in your brain. WHAT KIND OF SAMPLE IS TAKEN? A blood sample is required for this test. It is usually collected by inserting a needle into a vein.  HOW DO I PREPARE FOR THE TEST? There is no preparation required for this test. Be aware that your health care provider may require blood samples to be taken at regular intervals for up to 1 week. WHAT ARE THE REFERENCE RANGES? Reference ranges are considered healthy ranges established after testing a large group of healthy people. Reference ranges may vary among different people, labs, and hospitals. It is your responsibility to obtain your test results. Ask the lab or department performing the test when and how you will get your results.  The reference ranges for the CK test are as follows: Total CK:  Adult or elderly (values are higher after exercise):  Female: 55-170 units/L or 55-170 units/L (SI units).  Female: 30-135 units/L or 30-135 units/L (SI units).  Newborn: 68-580 units/L (SI units). Isoenzymes:  CK-MM: 100%.  CK-MB: 0%.  CK-BB: 0%. WHAT DO THE RESULTS MEAN? Levels of total CK that are above the reference ranges may indicate injury or diseases affecting the heart, skeletal muscle, or brain. Increased levels of CK-MM isoenzyme may indicate:  Certain diseases affecting the skeletal  muscle.  Recent surgery, trauma, or injury.  Conditions that cause convulsions. Increased levels of CK-MB isoenzyme may indicate:  Recent heart attack.  Other conditions that cause injury to the heart muscle. Increased levels of CK-BB isoenzyme may indicate:  Diseases that affect the central nervous system.  Certain psychiatric therapies.  Certain types of cancer.  Injury to the lungs. Talk with your health care provider to discuss your results, treatment options, and if necessary, the need for more tests. Talk with your health care provider if you have any questions about your results.   This information is not intended to replace advice given to you by your health care provider. Make sure you discuss any questions you have with your health care provider.   Document Released: 01/22/2004 Document Revised: 01/11/2014 Document Reviewed: 05/17/2013 Elsevier Interactive Patient Education [employer].

## 2016-02-10 ENCOUNTER — Other Ambulatory Visit: Payer: Self-pay | Admitting: Obstetrics and Gynecology

## 2019-05-17 ENCOUNTER — Other Ambulatory Visit: Payer: Self-pay

## 2019-05-17 MED ORDER — CLOBETASOL PROPIONATE 0.05 % EX CREA: 1.0000 "application " | TOPICAL_CREAM | Freq: Two times a day (BID) | CUTANEOUS | 0 refills | Status: DC

## 2019-05-17 NOTE — Progress Notes (Signed)
Patient asked for Clobetasol RF. She states she never picked up prescription from December.

## 2019-07-27 ENCOUNTER — Other Ambulatory Visit: Payer: Self-pay | Admitting: Dermatology

## 2019-11-14 ENCOUNTER — Telehealth (INDEPENDENT_AMBULATORY_CARE_PROVIDER_SITE_OTHER): Payer: PRIVATE HEALTH INSURANCE | Admitting: Dermatology

## 2019-11-14 ENCOUNTER — Other Ambulatory Visit: Payer: Self-pay

## 2019-11-14 DIAGNOSIS — L940 Localized scleroderma [morphea]: Secondary | ICD-10-CM | POA: Diagnosis not present

## 2019-11-14 DIAGNOSIS — L700 Acne vulgaris: Secondary | ICD-10-CM

## 2019-11-14 MED ORDER — CLINDAMYCIN PHOSPHATE 1 % EX SOLN: CUTANEOUS | 2 refills | Status: DC

## 2019-11-14 MED ORDER — TRETINOIN 0.025 % EX CREA: TOPICAL_CREAM | CUTANEOUS | 2 refills | Status: DC

## 2019-11-14 MED ORDER — DOXYCYCLINE HYCLATE 20 MG PO TABS: ORAL_TABLET | ORAL | 2 refills | Status: DC

## 2019-11-14 MED ORDER — CLOBETASOL PROPIONATE 0.05 % EX CREA: TOPICAL_CREAM | CUTANEOUS | 2 refills | Status: AC

## 2019-11-14 NOTE — Progress Notes (Addendum)
° °  Follow-Up Visit   Subjective  Traci Jones is a 24 y.o. female who presents for the following: Acne (1 year acne f/u).  Virtual visit today (VIDEO) Patient located in [place], [place] Physician located in [place], [place] Patient voices understanding their insurance will be billed just like for a regular in office visit  Virtual visit duration 22 minutes  The following portions of the chart were reviewed this encounter and updated as appropriate: Allergies   Meds   Problems   Med Hx   Surg Hx   Fam Hx       Review of Systems: No other skin or systemic complaints except as noted in HPI or Assessment and Plan.  Objective  Well appearing patient in no apparent distress; mood and affect are within normal limits.  A focused examination was performed including face, chest, and back. Relevant physical exam findings are noted in the Assessment and Plan.  Objective  jawline and back: 1+ open and closed comedones and scattered inflamed papules along jaw.  Resolving inflammatory papules on back.  Objective  Upper Back: Hyperpigmented atrophic plaques  Assessment & Plan  Acne vulgaris jawline and back  Chronic condition, no cure, only control. Currently flared.  Advised pt that stress and sugary foods can cause breakouts.   Discussed considering Spironolactone in the future. Will hold off for now since we are virtual so it is difficult to monitor blood pressure.   Start doxycycline 20 mg BID. Take with food. #60 2RF Doxycycline should be taken with food to prevent nausea. Do not lay down for 30 minutes after taking. Be cautious with sun exposure and use good sun protection while on this medication. Pregnant women should not take this medication.   Restart tretinoin 0.025% cream nightly. Apply thin layer to entire face.  Restart clindamycin solution.  Continue CLN wash.   doxycycline (PERIOSTAT) 20 MG tablet - jawline and back  clindamycin (CLEOCIN T) 1 % external solution -  jawline and back  tretinoin (RETIN-A) 0.025 % cream - jawline and back  Morphea Upper Back  Chronic, well-controlled currently. Reviewed risk of progressive scarring due to inflammation if inflammation does not stay well-controlled.  Restart clobetasol cream to active areas if itchy or tender. Do not apply to inactive, unchanging and asymptomatic spots.   Discussed with pt that morphea is hard to monitor virtually due to limitations of video quality and difficulty to assess for progression. Recommend that this be treated in person at the office in the future or for pt to find dermatologist closer to her for further treatment.   Advised pt of increased risk for lichen sclerosis et atrophicus. Pt will advise OB/GYN of condition.   clobetasol cream (TEMOVATE) 0.05 % - Upper Back  Return in about 3 months (around 02/14/2020) for 2-3 mo recheck acne.   I, Harriett Sine, CMA, am acting as scribe for Forest Gleason, MD.  Documentation: I have reviewed the above documentation for accuracy and completeness, and I agree with the above.  Forest Gleason, MD

## 2019-11-26 ENCOUNTER — Encounter: Payer: Self-pay | Admitting: Dermatology

## 2021-08-11 ENCOUNTER — Ambulatory Visit: Payer: 59 | Admitting: Dermatology

## 2021-08-11 ENCOUNTER — Encounter: Payer: Self-pay | Admitting: Dermatology

## 2021-08-11 DIAGNOSIS — L578 Other skin changes due to chronic exposure to nonionizing radiation: Secondary | ICD-10-CM

## 2021-08-11 DIAGNOSIS — L219 Seborrheic dermatitis, unspecified: Secondary | ICD-10-CM | POA: Diagnosis not present

## 2021-08-11 DIAGNOSIS — L858 Other specified epidermal thickening: Secondary | ICD-10-CM

## 2021-08-11 DIAGNOSIS — L940 Localized scleroderma [morphea]: Secondary | ICD-10-CM | POA: Diagnosis not present

## 2021-08-11 DIAGNOSIS — L700 Acne vulgaris: Secondary | ICD-10-CM | POA: Diagnosis not present

## 2021-08-11 DIAGNOSIS — L814 Other melanin hyperpigmentation: Secondary | ICD-10-CM | POA: Diagnosis not present

## 2021-08-11 DIAGNOSIS — D1800 Hemangioma unspecified site: Secondary | ICD-10-CM

## 2021-08-11 DIAGNOSIS — L821 Other seborrheic keratosis: Secondary | ICD-10-CM

## 2021-08-11 DIAGNOSIS — D229 Melanocytic nevi, unspecified: Secondary | ICD-10-CM

## 2021-08-11 MED ORDER — TRETINOIN 0.025 % EX CREA: TOPICAL_CREAM | CUTANEOUS | 5 refills | Status: DC

## 2021-08-11 MED ORDER — KETOCONAZOLE 2 % EX SHAM: MEDICATED_SHAMPOO | CUTANEOUS | 6 refills | Status: AC

## 2021-08-11 MED ORDER — CLINDAMYCIN PHOS-BENZOYL PEROX 1.2-2.5 % EX GEL: CUTANEOUS | 5 refills | Status: DC

## 2021-08-11 MED ORDER — FLUOCINONIDE 0.05 % EX SOLN: CUTANEOUS | 3 refills | Status: AC

## 2021-08-11 NOTE — Progress Notes (Signed)
Follow-Up Visit   Subjective  Traci Jones is a 26 y.o. female who presents for the following: Annual Exam (No personal H/O skin cancer or dysplastic nevi. Hx of morphea, diagnosed 12/07/2018. Used Clobetasol cream in the past. Getting new areas. No dyspnea, no dyphagia. Asymptomatic. Denies burning or pain during urination or sex) and Acne (Face. Flaring recently. Has taken Doxycycline 20 mg, used tretinoin 0.025% cream and Clindamycin solution in the past).  The patient presents for Total-Body Skin Exam (TBSE) for skin cancer screening and mole check.  The patient has spots, moles and lesions to be evaluated, some may be new or changing and the patient has concerns that these could be cancer.  The following portions of the chart were reviewed this encounter and updated as appropriate:  Tobacco  Allergies  Meds  Problems  Med Hx  Surg Hx  Fam Hx      Review of Systems: No other skin or systemic complaints except as noted in HPI or Assessment and Plan.   Objective  Well appearing patient in no apparent distress; mood and affect are within normal limits.  A full examination was performed including scalp, head, eyes, ears, nose, lips, neck, chest, axillae, abdomen, back, buttocks, bilateral upper extremities, bilateral lower extremities, hands, feet, fingers, toes, fingernails, and toenails. All findings within normal limits unless otherwise noted below.  Head - Anterior (Face) Erythematous papules and pustules with comedones   Mid Back 4.7 x 3.8 cm slightly atrophic hyperpigmented patch at right lower back. 3 slightly atrophic hyperpigmented patches at left neck, right inframammary, left hip, 2 at right axilla, right flank  Scalp Pink patches with greasy scale.    Assessment & Plan   Lentigines - Scattered tan macules - Due to sun exposure - Benign-appearing, observe - Recommend daily broad spectrum sunscreen SPF 30+ to sun-exposed areas, reapply every 2 hours as  needed. - Call for any changes  Seborrheic Keratoses - Stuck-on, waxy, tan-brown papules and/or plaques  - Benign-appearing - Discussed benign etiology and prognosis. - Observe - Call for any changes  Melanocytic Nevi - Tan-brown and/or pink-flesh-colored symmetric macules and papules - Benign appearing on exam today - Observation - Call clinic for new or changing moles - Recommend daily use of broad spectrum spf 30+ sunscreen to sun-exposed areas.   Hemangiomas - Red papules - Discussed benign nature - Observe - Call for any changes  Actinic Damage - Chronic condition, secondary to cumulative UV/sun exposure - diffuse scaly erythematous macules with underlying dyspigmentation - Recommend daily broad spectrum sunscreen SPF 30+ to sun-exposed areas, reapply every 2 hours as needed.  - Staying in the shade or wearing long sleeves, sun glasses (UVA+UVB protection) and wide brim hats (4-inch brim around the entire circumference of the hat) are also recommended for sun protection.  - Call for new or changing lesions.  Skin cancer screening performed today.  Keratosis Pilaris - Tiny follicular keratotic papules - Benign. Genetic in nature. No cure. - Observe. - If desired, patient can use an emollient (moisturizer) containing ammonium lactate, urea or salicylic acid once a day to smooth the area   Acne vulgaris Head - Anterior (Face)  Chronic and persistent condition with duration or expected duration over one year. Condition is symptomatic / bothersome to patient. Not to goal.  Start Tretinoin 0.25% cream at bedtime to face, wash off in morning.   Start Clindamycin-Benzoyl peroxide gel 1.2-2.5% in morning to face.    Topical retinoid medications like tretinoin/Retin-A, adapalene/Differin,  tazarotene/Fabior, and Epiduo/Epiduo Forte can cause dryness and irritation when first started. Only apply a pea-sized amount to the entire affected area. Avoid applying it around the  eyes, edges of mouth and creases at the nose. If you experience irritation, use a good moisturizer first and/or apply the medicine less often. If you are doing well with the medicine, you can increase how often you use it until you are applying every night. Be careful with sun protection while using this medication as it can make you sensitive to the sun. This medicine should not be used by pregnant women.    Clindamycin Phos-Benzoyl Perox gel - Head - Anterior (Face) Apply to face in morning, wash off at bedtime.  Related Medications tretinoin (RETIN-A) 0.025 % cream Apply to face at bedtime, wash off in morning.  Morphea Mid Back  Chronic condition with duration or expected duration over one year. Currently seems to be well-controlled though will recheck measurements to ensure sites not growing.   Discussed adding oral Plaquenil if she develops more lesions.  Discussed increase risk of LS et A with morphea diagnosis. No evidence today.  Continue clobetasol cream twice a day as needed for any new or changing lesions  Will recheck soon to ensure sites not growing  Related Medications clobetasol cream (TEMOVATE) 0.05 % Apply to affected areas as directed.  Seborrheic dermatitis Scalp  Chronic and persistent condition with duration or expected duration over one year. Condition is symptomatic / bothersome to patient. Not to goal.  Start Ketoconazole 2% shampoo 2-3 times per week, lather on scalp, leave on 5-10 minutes, rinse well.   Start Fluocinonide solution daily to affected areas on scalp as needed only.   Topical steroids (such as triamcinolone, fluocinolone, fluocinonide, mometasone, clobetasol, halobetasol, betamethasone, hydrocortisone) can cause thinning and lightening of the skin if they are used for too long in the same area. Your physician has selected the right strength medicine for your problem and area affected on the body. Please use your medication only as directed  by your physician to prevent side effects.    ketoconazole (NIZORAL) 2 % shampoo - Scalp 2-3 times per week, lather on scalp, leave on 5-10 minutes, rinse well.  fluocinonide (LIDEX) 0.05 % external solution - Scalp Apply daily to affected areas on scalp as needed for itching/scale   Return in about 2 months (around 10/11/2021) for Morphea recheck.  I, Emelia Salisbury, CMA, am acting as scribe for Forest Gleason, MD.  Documentation: I have reviewed the above documentation for accuracy and completeness, and I agree with the above.  Forest Gleason, MD

## 2021-08-11 NOTE — Patient Instructions (Addendum)
Scalp:  Start Ketoconazole 2% shampoo 2-3 times per week, lather on scalp, leave on 5-10 minutes, rinse well.   Start Fluocinonide solution daily to affected areas on scalp as needed only.   Topical steroids (such as triamcinolone, fluocinolone, fluocinonide, mometasone, clobetasol, halobetasol, betamethasone, hydrocortisone) can cause thinning and lightening of the skin if they are used for too long in the same area. Your physician has selected the right strength medicine for your problem and area affected on the body. Please use your medication only as directed by your physician to prevent side effects.    Acne: Start Tretinoin 0.25% cream at bedtime to face, wash off in morning.   Start Clindamycin-Benzoyl peroxide gel 1.2-2.5% in morning to face.    Topical retinoid medications like tretinoin/Retin-A, adapalene/Differin, tazarotene/Fabior, and Epiduo/Epiduo Forte can cause dryness and irritation when first started. Only apply a pea-sized amount to the entire affected area. Avoid applying it around the eyes, edges of mouth and creases at the nose. If you experience irritation, use a good moisturizer first and/or apply the medicine less often. If you are doing well with the medicine, you can increase how often you use it until you are applying every night. Be careful with sun protection while using this medication as it can make you sensitive to the sun. This medicine should not be used by pregnant women.   Recommend taking Heliocare sun protection supplement daily in sunny weather for additional sun protection. For maximum protection on the sunniest days, you can take up to 2 capsules of regular Heliocare OR take 1 capsule of Heliocare Ultra. For prolonged exposure (such as a full day in the sun), you can repeat your dose of the supplement 4 hours after your first dose. Heliocare can be purchased at [employer], at some [facility] or at [email or web address].    Recommend daily broad spectrum  sunscreen SPF 30+ to sun-exposed areas, reapply every 2 hours as needed. Call for new or changing lesions.  Staying in the shade or wearing long sleeves, sun glasses (UVA+UVB protection) and wide brim hats (4-inch brim around the entire circumference of the hat) are also recommended for sun protection.     Melanoma ABCDEs  Melanoma is the most dangerous type of skin cancer, and is the leading cause of death from skin disease.  You are more likely to develop melanoma if you: Have light-colored skin, light-colored eyes, or red or blond hair Spend a lot of time in the sun Tan regularly, either outdoors or in a tanning bed Have had blistering sunburns, especially during childhood Have a close family member who has had a melanoma Have atypical moles or large birthmarks  Early detection of melanoma is key since treatment is typically straightforward and cure rates are extremely high if we catch it early.   The first sign of melanoma is often a change in a mole or a new dark spot.  The ABCDE system is a way of remembering the signs of melanoma.  A for asymmetry:  The two halves do not match. B for border:  The edges of the growth are irregular. C for color:  A mixture of colors are present instead of an even brown color. D for diameter:  Melanomas are usually (but not always) greater than 74m - the size of a pencil eraser. E for evolution:  The spot keeps changing in size, shape, and color.  Please check your skin once per month between visits. You can use a small mirror in front  and a large mirror behind you to keep an eye on the back side or your body.   If you see any new or changing lesions before your next follow-up, please call to schedule a visit.  Please continue daily skin protection including broad spectrum sunscreen SPF 30+ to sun-exposed areas, reapplying every 2 hours as needed when you're outdoors.   Staying in the shade or wearing long sleeves, sun glasses (UVA+UVB protection)  and wide brim hats (4-inch brim around the entire circumference of the hat) are also recommended for sun protection.     Due to recent changes in healthcare laws, you may see results of your pathology and/or laboratory studies on MyChart before the doctors have had a chance to review them. We understand that in some cases there may be results that are confusing or concerning to you. Please understand that not all results are received at the same time and often the doctors may need to interpret multiple results in order to provide you with the best plan of care or course of treatment. Therefore, we ask that you please give [place] 2 business days to thoroughly review all your results before contacting the office for clarification. Should we see a critical lab result, you will be contacted sooner.   If You Need Anything After Your Visit  If you have any questions or concerns for your doctor, please call our main line at [phone] and press option 4 to reach your doctor's medical assistant. If no one answers, please leave a voicemail as directed and we will return your call as soon as possible. Messages left after 4 pm will be answered the following business day.   You may also send [place] a message via [place]. We typically respond to MyChart messages within 1-2 business days.  For prescription refills, please ask your pharmacy to contact our office. Our fax number is [phone].  If you have an urgent issue when the clinic is closed that cannot wait until the next business day, you can page your doctor at the number below.    Please note that while we do our best to be available for urgent issues outside of office hours, we are not available 24/7.   If you have an urgent issue and are unable to reach [place], you may choose to seek medical care at your doctor's office, retail clinic, urgent care center, or emergency room.  If you have a medical emergency, please immediately call 911 or go to the emergency  department.  Pager Numbers  - Dr. Nehemiah Massed: [phone]  - Dr. Laurence Ferrari: [phone]  - Dr. Nicole Kindred: [phone]  In the event of inclement weather, please call our main line at [phone] for an update on the status of any delays or closures.  Dermatology Medication Tips: Please keep the boxes that topical medications come in in order to help keep track of the instructions about where and how to use these. Pharmacies typically print the medication instructions only on the boxes and not directly on the medication tubes.   If your medication is too expensive, please contact our office at [phone] option 4 or send [place] a message through [place].   We are unable to tell what your co-pay for medications will be in advance as this is different depending on your insurance coverage. However, we may be able to find a substitute medication at lower cost or fill out paperwork to get insurance to cover a needed medication.   If a prior authorization is required  to get your medication covered by your insurance company, please allow [place] 1-2 business days to complete this process.  Drug prices often vary depending on where the prescription is filled and some pharmacies may offer cheaper prices.  The website www.goodrx.com contains coupons for medications through different pharmacies. The prices here do not account for what the cost may be with help from insurance (it may be cheaper with your insurance), but the website can give you the price if you did not use any insurance.  - You can print the associated coupon and take it with your prescription to the pharmacy.  - You may also stop by our office during regular business hours and pick up a GoodRx coupon card.  - If you need your prescription sent electronically to a different pharmacy, notify our office through [facility] or by phone at [phone] option 4.     Si Usted Necesita Algo Despus de Su Visita  Tambin puede enviarnos un  mensaje a travs de Pharmacist, community. Por lo general respondemos a los mensajes de MyChart en el transcurso de 1 a 2 das hbiles.  Para renovar recetas, por favor pida a su farmacia que se ponga en contacto con nuestra oficina. Harland Dingwall de fax es [place] [phone].  Si tiene un asunto urgente cuando la clnica est cerrada y que no puede esperar hasta el siguiente da hbil, puede llamar/localizar a su doctor(a) al nmero que aparece a continuacin.   Por favor, tenga en cuenta que aunque hacemos todo lo posible para estar disponibles para asuntos urgentes fuera del horario de [place], no estamos disponibles las 24 horas del da, los 7 das de la [place].   Si tiene un problema urgente y no puede comunicarse con nosotros, puede optar por buscar atencin mdica  en el consultorio de su doctor(a), en una clnica privada, en un centro de atencin urgente o en una sala de emergencias.  Si tiene Engineering geologist, por favor llame inmediatamente al 911 o vaya a la sala de emergencias.  Nmeros de bper  - Dr. Nehemiah Massed: [phone]  - Dra. Moye: [phone]  - Dra. Nicole Kindred: [phone]  En caso de inclemencias del [place], por favor llame a Johnsie Kindred principal al [phone] para una actualizacin sobre el [place] de cualquier retraso o cierre.  Consejos para la medicacin en dermatologa: Por favor, guarde las cajas en las que vienen los medicamentos de uso tpico para ayudarle a seguir las instrucciones sobre dnde y cmo usarlos. Las farmacias generalmente imprimen las instrucciones del medicamento slo en las cajas y no directamente en los tubos del [facility].   Si su medicamento es muy caro, por favor, pngase en contacto con Zigmund Daniel llamando al [phone] y presione la opcin 4 o envenos un mensaje a travs de Pharmacist, community.   No podemos decirle cul ser su copago por los medicamentos por adelantado ya que esto es diferente dependiendo de la cobertura de su seguro. Sin embargo,  es posible que podamos encontrar un medicamento sustituto a Electrical engineer un formulario para que el seguro cubra el medicamento que se considera necesario.   Si se requiere una autorizacin previa para que su compaa de seguros [place] su medicamento, por favor permtanos de 1 a 2 das hbiles para completar este proceso.  Los precios de los medicamentos varan con frecuencia dependiendo del Environmental consultant de dnde se surte la receta y alguna farmacias pueden ofrecer precios ms baratos.  El sitio web www.goodrx.com tiene cupones para medicamentos de [employer]  farmacias. Los precios aqu no tienen en cuenta lo que podra costar con la ayuda del seguro (puede ser ms barato con su seguro), pero el sitio web puede darle el precio si no utiliz Research scientist (physical sciences).  - Puede imprimir el cupn correspondiente y llevarlo con su receta a la farmacia.  - Tambin puede pasar por nuestra oficina durante el horario de atencin regular y Charity fundraiser una tarjeta de cupones de GoodRx.  - Si necesita que su receta se enve electrnicamente a una farmacia diferente, informe a nuestra oficina a travs de MyChart de [place] o por telfono llamando al [phone] y presione la opcin 4.

## 2021-08-20 ENCOUNTER — Encounter: Payer: Self-pay | Admitting: Dermatology

## 2021-10-14 ENCOUNTER — Ambulatory Visit (INDEPENDENT_AMBULATORY_CARE_PROVIDER_SITE_OTHER): Payer: No Typology Code available for payment source | Admitting: Dermatology

## 2021-10-14 DIAGNOSIS — L720 Epidermal cyst: Secondary | ICD-10-CM | POA: Diagnosis not present

## 2021-10-14 DIAGNOSIS — L700 Acne vulgaris: Secondary | ICD-10-CM | POA: Diagnosis not present

## 2021-10-14 DIAGNOSIS — L940 Localized scleroderma [morphea]: Secondary | ICD-10-CM | POA: Diagnosis not present

## 2021-10-14 MED ORDER — TRETINOIN 0.05 % EX CREA: TOPICAL_CREAM | Freq: Every day | CUTANEOUS | 0 refills | Status: DC

## 2021-10-14 NOTE — Patient Instructions (Addendum)
Topical retinoid medications like tretinoin/Retin-A, adapalene/Differin, tazarotene/Fabior, and Epiduo/Epiduo Forte can cause dryness and irritation when first started. Only apply a pea-sized amount to the entire affected area. Avoid applying it around the eyes, edges of mouth and creases at the nose. If you experience irritation, use a good moisturizer first and/or apply the medicine less often. If you are doing well with the medicine, you can increase how often you use it until you are applying every night. Be careful with sun protection while using this medication as it can make you sensitive to the sun. This medicine should not be used by pregnant women.    Due to recent changes in healthcare laws, you may see results of your pathology and/or laboratory studies on MyChart before the doctors have had a chance to review them. We understand that in some cases there may be results that are confusing or concerning to you. Please understand that not all results are received at the same time and often the doctors may need to interpret multiple results in order to provide you with the best plan of care or course of treatment. Therefore, we ask that you please give us 2 business days to thoroughly review all your results before contacting the office for clarification. Should we see a critical lab result, you will be contacted sooner.   If You Need Anything After Your Visit  If you have any questions or concerns for your doctor, please call our main line at [phone] and press option 4 to reach your doctor's medical assistant. If no one answers, please leave a voicemail as directed and we will return your call as soon as possible. Messages left after 4 pm will be answered the following business day.   You may also send us a message via MyChart. We typically respond to MyChart messages within 1-2 business days.  For prescription refills, please ask your pharmacy to contact our office. Our fax number is  [phone].  If you have an urgent issue when the clinic is closed that cannot wait until the next business day, you can page your doctor at the number below.    Please note that while we do our best to be available for urgent issues outside of office hours, we are not available 24/7.   If you have an urgent issue and are unable to reach us, you may choose to seek medical care at your doctor's office, retail clinic, urgent care center, or emergency room.  If you have a medical emergency, please immediately call 911 or go to the emergency department.  Pager Numbers  - Dr. Kowalski: [phone]  - Dr. Moye: [phone]  - Dr. Stewart: [phone]  In the event of inclement weather, please call our main line at [phone] for an update on the status of any delays or closures.  Dermatology Medication Tips: Please keep the boxes that topical medications come in in order to help keep track of the instructions about where and how to use these. Pharmacies typically print the medication instructions only on the boxes and not directly on the medication tubes.   If your medication is too expensive, please contact our office at [phone] option 4 or send us a message through MyChart.   We are unable to tell what your co-pay for medications will be in advance as this is different depending on your insurance coverage. However, we may be able to find a substitute medication at lower cost or fill out paperwork to get insurance to cover a   needed medication.   If a prior authorization is required to get your medication covered by your insurance company, please allow us 1-2 business days to complete this process.  Drug prices often vary depending on where the prescription is filled and some pharmacies may offer cheaper prices.  The website www.goodrx.com contains coupons for medications through different pharmacies. The prices here do not account for what the cost may be with help from  insurance (it may be cheaper with your insurance), but the website can give you the price if you did not use any insurance.  - You can print the associated coupon and take it with your prescription to the pharmacy.  - You may also stop by our office during regular business hours and pick up a GoodRx coupon card.  - If you need your prescription sent electronically to a different pharmacy, notify our office through [facility] MyChart or by phone at [phone] option 4.     Si Usted Necesita Algo Despus de Su Visita  Tambin puede enviarnos un mensaje a travs de MyChart. Por lo general respondemos a los mensajes de MyChart en el transcurso de 1 a 2 das hbiles.  Para renovar recetas, por favor pida a su farmacia que se ponga en contacto con nuestra oficina. Nuestro nmero de fax es el [phone].  Si tiene un asunto urgente cuando la clnica est cerrada y que no puede esperar hasta el siguiente da hbil, puede llamar/localizar a su doctor(a) al nmero que aparece a continuacin.   Por favor, tenga en cuenta que aunque hacemos todo lo posible para estar disponibles para asuntos urgentes fuera del horario de oficina, no estamos disponibles las 24 horas del da, los 7 das de la semana.   Si tiene un problema urgente y no puede comunicarse con nosotros, puede optar por buscar atencin mdica  en el consultorio de su doctor(a), en una clnica privada, en un centro de atencin urgente o en una sala de emergencias.  Si tiene una emergencia mdica, por favor llame inmediatamente al 911 o vaya a la sala de emergencias.  Nmeros de bper  - Dr. Kowalski: [phone]  - Dra. Moye: [phone]  - Dra. Stewart: [phone]  En caso de inclemencias del tiempo, por favor llame a nuestra lnea principal al [phone] para una actualizacin sobre el estado de cualquier retraso o cierre.  Consejos para la medicacin en dermatologa: Por favor, guarde las cajas en las que vienen los  medicamentos de uso tpico para ayudarle a seguir las instrucciones sobre dnde y cmo usarlos. Las farmacias generalmente imprimen las instrucciones del medicamento slo en las cajas y no directamente en los tubos del medicamento.   Si su medicamento es muy caro, por favor, pngase en contacto con nuestra oficina llamando al [phone] y presione la opcin 4 o envenos un mensaje a travs de MyChart.   No podemos decirle cul ser su copago por los medicamentos por adelantado ya que esto es diferente dependiendo de la cobertura de su seguro. Sin embargo, es posible que podamos encontrar un medicamento sustituto a menor costo o llenar un formulario para que el seguro cubra el medicamento que se considera necesario.   Si se requiere una autorizacin previa para que su compaa de seguros cubra su medicamento, por favor permtanos de 1 a 2 das hbiles para completar este proceso.  Los precios de los medicamentos varan con frecuencia dependiendo del lugar de dnde se surte la receta y alguna farmacias pueden ofrecer precios ms baratos.    baratos.  El sitio web www.goodrx.com tiene cupones para medicamentos de Airline pilot. Los precios aqu no tienen en cuenta lo que podra costar con la ayuda del seguro (puede ser ms barato con su seguro), pero el sitio web puede darle el precio si no utiliz Research scientist (physical sciences).  - Puede imprimir el cupn correspondiente y llevarlo con su receta a la farmacia.  - Tambin puede pasar por nuestra oficina durante el horario de atencin regular y Charity fundraiser una tarjeta de cupones de GoodRx.  - Si necesita que su receta se enve electrnicamente a una farmacia diferente, informe a nuestra oficina a travs de MyChart de [place] o por telfono llamando al [phone] y presione la opcin 4.

## 2021-10-14 NOTE — Progress Notes (Signed)
Follow-Up Visit   Subjective  Traci Jones is a 26 y.o. female who presents for the following: Follow-up (Patient here for 2 month follow up on acne and morphea).  She has a spot on her cheek which has been irritated and raised for a couple of months.  The following portions of the chart were reviewed this encounter and updated as appropriate:  Tobacco  Allergies  Meds  Problems  Med Hx  Surg Hx  Fam Hx      Review of Systems: No other skin or systemic complaints except as noted in HPI or Assessment and Plan.   Objective  Well appearing patient in no apparent distress; mood and affect are within normal limits.  A focused examination was performed including face, chest, back, lower abdomen and left cheek . Relevant physical exam findings are noted in the Assessment and Plan.  face, chest, back 1+ open comedones, rare inflammatory papule  mid back and lower left abdomen Hyperpigmented slightly atrophic patches at back See photo     left cheek Smooth white papule   Assessment & Plan  Acne vulgaris face, chest, back  Chronic and persistent condition with duration or expected duration over one year. Condition is symptomatic / bothersome to patient. Not to goal.   Increase Tretinoin 0.25% to 0.05 % to cream at bedtime to face then apply thin layer Aquaphor, wash off in morning.   Also given samples   Continue Clindamycin-Benzoyl peroxide gel 1.2-2.5% in morning to face.      Topical retinoid medications like tretinoin/Retin-A, adapalene/Differin, tazarotene/Fabior, and Epiduo/Epiduo Forte can cause dryness and irritation when first started. Only apply a pea-sized amount to the entire affected area. Avoid applying it around the eyes, edges of mouth and creases at the nose. If you experience irritation, use a good moisturizer first and/or apply the medicine less often. If you are doing well with the medicine, you can increase how often you use it until you are  applying every night. Be careful with sun protection while using this medication as it can make you sensitive to the sun. This medicine should not be used by pregnant women.   Benzoyl peroxide can cause dryness and irritation of the skin. It can also bleach fabric. When used together with Aczone (dapsone) cream, it can stain the skin orange.    tretinoin (RETIN-A) 0.05 % cream - face, chest, back Apply topically at bedtime.  Related Medications Clindamycin Phos-Benzoyl Perox gel Apply to face in morning, wash off at bedtime.  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 Currently seems to be well-controlled though will recheck measurements to ensure sites not growing.   Start clobetasol cream twice a day as needed for up to 2 weeks to low abdomen. If not tender, not growing, d/c and monitor. Also use to any  new or changing sites. Avoid applying to face, groin, and axilla. Use as directed. Long-term use can cause thinning of the skin.  Topical steroids (such as triamcinolone, fluocinolone, fluocinonide, mometasone, clobetasol, halobetasol, betamethasone, hydrocortisone) can cause thinning and lightening of the skin if they are used for too long in the same area. Your physician has selected the right strength medicine for your problem and area affected on the body. Please use your medication only as directed by your physician to prevent side effects.    Related Medications clobetasol cream (TEMOVATE) 0.05 % Apply to affected areas as directed.  Milia left  cheek  Symptomatic per patient. Inflamed on exam.   Acne/Milia surgery - left cheek Procedure risks and benefits were discussed with the patient and verbal consent was obtained. Following prep of the skin on the left cheek with an alcohol swab, extraction of milia was performed with a comedone extractor following superficial incision made over their surfaces with an 18  gauge needle. Capillary hemostasis was achieved with 20% aluminum chloride solution. Vaseline ointment was applied to each site. The patient tolerated the procedure well.   Return in about 3 months (around 01/14/2022) for follow up. I, Ruthell Rummage, CMA, am acting as scribe for Forest Gleason, MD.  Documentation: I have reviewed the above documentation for accuracy and completeness, and I agree with the above.  Forest Gleason, MD

## 2021-10-15 ENCOUNTER — Encounter: Payer: Self-pay | Admitting: Dermatology

## 2021-12-21 ENCOUNTER — Other Ambulatory Visit: Payer: Self-pay | Admitting: Dermatology

## 2021-12-21 DIAGNOSIS — L700 Acne vulgaris: Secondary | ICD-10-CM

## 2021-12-21 MED ORDER — TRETINOIN 0.05 % EX CREA: TOPICAL_CREAM | Freq: Every day | CUTANEOUS | 11 refills | Status: AC

## 2021-12-21 MED ORDER — CLOBETASOL PROPIONATE 0.05 % EX CREA: TOPICAL_CREAM | Freq: Two times a day (BID) | CUTANEOUS | 2 refills | Status: AC

## 2021-12-21 MED ORDER — CLINDAMYCIN PHOSPHATE 1 % EX GEL: Freq: Two times a day (BID) | CUTANEOUS | 0 refills | Status: AC

## 2021-12-21 NOTE — Progress Notes (Signed)
Sent in refills per patient request.

## 2022-01-14 ENCOUNTER — Ambulatory Visit: Payer: No Typology Code available for payment source | Admitting: Dermatology

## 2022-02-20 ENCOUNTER — Encounter: Payer: Self-pay | Admitting: Dermatology

## 2022-03-02 NOTE — Telephone Encounter (Signed)
Called patient All questions answered.
